# Patient Record
Sex: Male | Born: 1995 | Race: White | Hispanic: No | Marital: Married | Smoking: Former smoker
Health system: Southern US, Community
[De-identification: ages and names within clinical notes are randomized; demographics above are authoritative.]

## PROBLEM LIST (undated history)

## (undated) DIAGNOSIS — F909 Attention-deficit hyperactivity disorder, unspecified type: Secondary | ICD-10-CM

## (undated) DIAGNOSIS — S3609XA Other injury of spleen, initial encounter: Secondary | ICD-10-CM

## (undated) DIAGNOSIS — S060XAA Concussion with loss of consciousness status unknown, initial encounter: Secondary | ICD-10-CM

## (undated) HISTORY — DX: Other injury of spleen, initial encounter: S36.09XA

## (undated) HISTORY — DX: Attention-deficit hyperactivity disorder, unspecified type: F90.9

## (undated) HISTORY — PX: SPLENECTOMY, TOTAL: SHX788

---
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
## 2014-11-29 ENCOUNTER — Encounter (HOSPITAL_COMMUNITY): Payer: Self-pay | Admitting: Emergency Medicine

## 2014-11-29 ENCOUNTER — Emergency Department (HOSPITAL_COMMUNITY)
Admission: EM | Admit: 2014-11-29 | Discharge: 2014-11-30 | Disposition: A | Discharge status: Home / Self Care | Payer: No Typology Code available for payment source | Attending: Emergency Medicine | Admitting: Emergency Medicine

## 2014-11-29 DIAGNOSIS — Z720 Tobacco use: Secondary | ICD-10-CM | POA: Diagnosis not present

## 2014-11-29 DIAGNOSIS — Y998 Other external cause status: Secondary | ICD-10-CM | POA: Insufficient documentation

## 2014-11-29 DIAGNOSIS — Z8659 Personal history of other mental and behavioral disorders: Secondary | ICD-10-CM | POA: Insufficient documentation

## 2014-11-29 DIAGNOSIS — S81812A Laceration without foreign body, left lower leg, initial encounter: Secondary | ICD-10-CM | POA: Diagnosis not present

## 2014-11-29 DIAGNOSIS — W228XXA Striking against or struck by other objects, initial encounter: Secondary | ICD-10-CM | POA: Insufficient documentation

## 2014-11-29 DIAGNOSIS — Z79899 Other long term (current) drug therapy: Secondary | ICD-10-CM | POA: Insufficient documentation

## 2014-11-29 DIAGNOSIS — Z8679 Personal history of other diseases of the circulatory system: Secondary | ICD-10-CM | POA: Diagnosis not present

## 2014-11-29 DIAGNOSIS — S8992XA Unspecified injury of left lower leg, initial encounter: Secondary | ICD-10-CM | POA: Diagnosis present

## 2014-11-29 DIAGNOSIS — Y9389 Activity, other specified: Secondary | ICD-10-CM | POA: Insufficient documentation

## 2014-11-29 DIAGNOSIS — Y9289 Other specified places as the place of occurrence of the external cause: Secondary | ICD-10-CM | POA: Insufficient documentation

## 2014-11-29 MED ORDER — LIDOCAINE-EPINEPHRINE (PF) 2 %-1:200000 IJ SOLN
20.0000 mL | Freq: Once | INTRAMUSCULAR | Status: AC
  Administered 2014-11-30: 20 mL
  Filled 2014-11-29: qty 20

## 2014-11-29 NOTE — ED Notes (Signed)
Pt states he cut his left lower leg with a car spring as he was trying to cut it

## 2014-11-29 NOTE — ED Provider Notes (Signed)
CSN: [id number]     Arrival date & time 11/29/14  2317 History  This chart was scribed for non-physician practitioner, Antony MaduraKelly Denzal Meir, PA-C working with April Palumbo, MD by Placido SouLogan Joldersma, ED scribe. This patient was seen in room WTR6/WTR6 and the patient's care was started at 11:31 PM.  Chief Complaint  Patient presents with  . Extremity Laceration   The history is provided by the patient. No language interpreter was used.    HPI Comments: Joel Harper is a 19 y.o. male who presents to the Emergency Department complaining of a laceration to the anterior of his left lower leg with onset earlier today. Pt notes cutting springs with a grinder and one came back and struck his leg resulting in the laceration. He notes his most recent tetanus vaccination was within the last 10 years. He notes a history of a bleeding ulcer and due to this he doesn't like taking pain medications. Pt denies any other complaints.     Past Medical History  Diagnosis Date  . Ruptured spleen   . ADHD (attention deficit hyperactivity disorder)    Past Surgical History  Procedure Laterality Date  . Splenectomy, total      age 89   Family History  Problem Relation Age of Onset  . Colitis Mother   . Migraines Mother   . Hypertension Mother   . Migraines Maternal Uncle     x2  . Migraines Brother   . Liver disease Maternal Uncle    History  Substance Use Topics  . Smoking status: Current Every Day Smoker  . Smokeless tobacco: Current User  . Alcohol Use: Yes    Review of Systems  Skin: Positive for wound.  All other systems reviewed and are negative.   Allergies  Review of patient's allergies indicates no known allergies.  Home Medications   Prior to Admission medications   Medication Sig Start Date End Date Taking? Authorizing Provider  bismuth-metronidazole-tetracycline (PYLERA) 140-125-125 MG per capsule Take 3 capsules by mouth 4 (four) times daily. 12/27/11 01/10/12  Meryl DareMalcolm T Stark, MD   pantoprazole (PROTONIX) 20 MG tablet TAKE 2 TABLETS BY MOUTH DAILY 12/05/11   Meryl DareMalcolm T Stark, MD  sucralfate (CARAFATE) 1 G tablet Take 1 tablet (1 g total) by mouth 4 (four) times daily. 11/17/11 11/16/12  Marisa Severinlga Otter, MD   BP 130/73 mmHg  Pulse 73  Temp(Src) 98.1 F (36.7 C) (Oral)  Resp 20  SpO2 98%   Physical Exam  Constitutional: He is oriented to person, place, and time. He appears well-developed and well-nourished. No distress.  HENT:  Head: Normocephalic and atraumatic.  Eyes: Conjunctivae and EOM are normal. No scleral icterus.  Neck: Normal range of motion.  Cardiovascular: Normal rate, regular rhythm and intact distal pulses.   DP and PT pulses 2+ in the left lower extremity  Pulmonary/Chest: Effort normal. No respiratory distress.  Musculoskeletal: Normal range of motion.       Left lower leg: He exhibits tenderness and laceration. He exhibits no bony tenderness, no swelling, no edema and no deformity.       Legs: Neurological: He is alert and oriented to person, place, and time. He exhibits normal muscle tone. Coordination normal.  Sensation to light touch intact  Skin: Skin is warm and dry. No rash noted. He is not diaphoretic. No erythema. No pallor.  5cm laceration to the anterior left lower leg. No active bleeding.  Psychiatric: He has a normal mood and affect. His behavior is normal.  Nursing note and vitals reviewed.   ED Course  Procedures  DIAGNOSTIC STUDIES: Oxygen Saturation is 98% on RA, normal by my interpretation.    COORDINATION OF CARE: 11:35 PM Discussed treatment plan with pt at bedside and pt agreed to plan.  Labs Review Labs Reviewed - No data to display  Imaging Review No results found.   EKG Interpretation None      LACERATION REPAIR Performed by: Antony Madura Authorized by: Antony Madura Consent: Verbal consent obtained. Risks and benefits: risks, benefits and alternatives were discussed Consent given by: patient Patient identity  confirmed: provided demographic data Prepped and Draped in normal sterile fashion Wound explored  Laceration Location: Left lower leg  Laceration Length: 5cm  No Foreign Bodies seen or palpated  Anesthesia: local infiltration  Local anesthetic: lidocaine 2% with epinephrine  Anesthetic total: 4 ml  Irrigation method: syringe Amount of cleaning: standard  Skin closure: 5-0 ethilon  Number of sutures: 5  Technique: simple interrupted  Patient tolerance: Patient tolerated the procedure well with no immediate complications.   MDM   Final diagnoses:  Laceration of left lower leg, initial encounter    Tdap booster UTD. Pressure irrigation performed. Laceration occurred < 8 hours prior to repair which was well tolerated. Pt has no comorbidities to effect normal wound healing. Discussed suture home care w pt and answered questions. Pt to follow up for wound check and suture removal in 10-12 days. Pt is hemodynamically stable with no complaints prior to dc.    I personally performed the services described in this documentation, which was scribed in my presence. The recorded information has been reviewed and is accurate.   Filed Vitals:   11/29/14 2330  BP: 130/73  Pulse: 73  Temp: 98.1 F (36.7 C)  TempSrc: Oral  Resp: 20  SpO2: 98%     Antony Madura, PA-C 11/30/14 0016  April Palumbo, MD 11/30/14 [phone]

## 2014-11-30 MED ORDER — ONDANSETRON 4 MG PO TBDP
4.0000 mg | ORAL_TABLET | Freq: Once | ORAL | Status: AC
  Administered 2014-11-30: 4 mg via ORAL
  Filled 2014-11-30: qty 1

## 2014-11-30 NOTE — Discharge Instructions (Signed)

## 2022-09-26 ENCOUNTER — Encounter (HOSPITAL_COMMUNITY): Payer: Self-pay

## 2022-09-26 ENCOUNTER — Emergency Department (HOSPITAL_COMMUNITY): Payer: Self-pay

## 2022-09-26 ENCOUNTER — Other Ambulatory Visit: Payer: Self-pay

## 2022-09-26 ENCOUNTER — Emergency Department (HOSPITAL_COMMUNITY)
Admission: EM | Admit: 2022-09-26 | Discharge: 2022-09-26 | Disposition: A | Discharge status: Home / Self Care | Payer: Self-pay | Attending: Emergency Medicine | Admitting: Emergency Medicine

## 2022-09-26 DIAGNOSIS — Y9210 Unspecified residential institution as the place of occurrence of the external cause: Secondary | ICD-10-CM | POA: Insufficient documentation

## 2022-09-26 DIAGNOSIS — S0990XA Unspecified injury of head, initial encounter: Secondary | ICD-10-CM

## 2022-09-26 DIAGNOSIS — S0083XA Contusion of other part of head, initial encounter: Secondary | ICD-10-CM | POA: Insufficient documentation

## 2022-09-26 HISTORY — DX: Concussion with loss of consciousness status unknown, initial encounter: S06.0XAA
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
## 2022-09-26 NOTE — Discharge Instructions (Addendum)
CAT scan today was negative without any signs of bleeding.  You may have a mild concussion and it would not be unusual to have intermittent headaches over the next few days.  If you start having confusion, persistent vomiting, difficulty walking or change in your vision you should return to the emergency room.
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
